# Patient Record
Sex: Female | Born: 1955 | Race: White | Hispanic: No | Marital: Married
Health system: Southern US, Community
[De-identification: ages and names within clinical notes are randomized; demographics above are authoritative.]

## PROBLEM LIST (undated history)

## (undated) DIAGNOSIS — E079 Disorder of thyroid, unspecified: Secondary | ICD-10-CM

## (undated) HISTORY — PX: ABDOMINAL HYSTERECTOMY: SHX81

---

## 2013-04-03 ENCOUNTER — Encounter (HOSPITAL_BASED_OUTPATIENT_CLINIC_OR_DEPARTMENT_OTHER): Payer: Self-pay | Admitting: Emergency Medicine

## 2013-04-03 ENCOUNTER — Emergency Department (HOSPITAL_BASED_OUTPATIENT_CLINIC_OR_DEPARTMENT_OTHER): Payer: PRIVATE HEALTH INSURANCE

## 2013-04-03 ENCOUNTER — Emergency Department (HOSPITAL_BASED_OUTPATIENT_CLINIC_OR_DEPARTMENT_OTHER)
Admission: EM | Admit: 2013-04-03 | Discharge: 2013-04-03 | Disposition: A | Discharge status: Home / Self Care | Payer: PRIVATE HEALTH INSURANCE | Attending: Emergency Medicine | Admitting: Emergency Medicine

## 2013-04-03 DIAGNOSIS — Z79899 Other long term (current) drug therapy: Secondary | ICD-10-CM | POA: Insufficient documentation

## 2013-04-03 DIAGNOSIS — W19XXXA Unspecified fall, initial encounter: Secondary | ICD-10-CM

## 2013-04-03 DIAGNOSIS — IMO0002 Reserved for concepts with insufficient information to code with codable children: Secondary | ICD-10-CM | POA: Insufficient documentation

## 2013-04-03 DIAGNOSIS — E079 Disorder of thyroid, unspecified: Secondary | ICD-10-CM | POA: Insufficient documentation

## 2013-04-03 DIAGNOSIS — M549 Dorsalgia, unspecified: Secondary | ICD-10-CM

## 2013-04-03 DIAGNOSIS — Y939 Activity, unspecified: Secondary | ICD-10-CM | POA: Insufficient documentation

## 2013-04-03 DIAGNOSIS — Y92009 Unspecified place in unspecified non-institutional (private) residence as the place of occurrence of the external cause: Secondary | ICD-10-CM | POA: Insufficient documentation

## 2013-04-03 DIAGNOSIS — W108XXA Fall (on) (from) other stairs and steps, initial encounter: Secondary | ICD-10-CM | POA: Insufficient documentation

## 2013-04-03 DIAGNOSIS — S298XXA Other specified injuries of thorax, initial encounter: Secondary | ICD-10-CM | POA: Insufficient documentation

## 2013-04-03 HISTORY — DX: Disorder of thyroid, unspecified: E07.9

## 2013-04-03 MED ORDER — CYCLOBENZAPRINE HCL 10 MG PO TABS
ORAL_TABLET | ORAL | Status: AC
  Administered 2013-04-03: 12:00:00 10 mg via ORAL
  Filled 2013-04-03: qty 1

## 2013-04-03 MED ORDER — CYCLOBENZAPRINE HCL 10 MG PO TABS
10.0000 mg | ORAL_TABLET | Freq: Once | ORAL | Status: AC
  Administered 2013-04-03: 10 mg via ORAL

## 2013-04-03 MED ORDER — CYCLOBENZAPRINE HCL 10 MG PO TABS: 10.0000 mg | ORAL_TABLET | Freq: Three times a day (TID) | ORAL | Status: AC | PRN

## 2013-04-03 MED ORDER — IBUPROFEN 600 MG PO TABS: 600.0000 mg | ORAL_TABLET | Freq: Three times a day (TID) | ORAL | Status: AC | PRN

## 2013-04-03 MED ORDER — HYDROCODONE-ACETAMINOPHEN 5-325 MG PO TABS
1.0000 | ORAL_TABLET | Freq: Four times a day (QID) | ORAL | Status: AC | PRN
Start: 2013-04-03 — End: ?

## 2013-04-03 MED ORDER — IBUPROFEN 400 MG PO TABS
600.0000 mg | ORAL_TABLET | Freq: Once | ORAL | Status: AC
  Administered 2013-04-03: 11:00:00 600 mg via ORAL
  Filled 2013-04-03 (×2): qty 1

## 2013-04-03 NOTE — ED Provider Notes (Signed)
CSN: [id number]     Arrival date & time 04/03/13  1101 History   First MD Initiated Contact with Patient 04/03/13 1113     Chief Complaint  Patient presents with  . Fall  . Back Pain  . Rib Injury   (Consider location/radiation/quality/duration/timing/severity/associated sxs/prior Treatment) HPI Comments: 58 year old female fell down the steps at home. She slipped on ice and fell down about 6 steps. They are concrete. She fell on the left side of her body her left posterior chest. She believes she has rib fractures she denies any shortness of breath, hemoptysis  Patient is a 58 y.o. female presenting with fall and back pain. The history is provided by the patient.  Fall This is a new problem. The current episode started less than 1 hour ago. Episode frequency: once. The problem has not changed since onset.Associated symptoms include chest pain (posterior). Pertinent negatives include no abdominal pain. Exacerbated by: deep breathing. Nothing relieves the symptoms.  Back Pain Associated symptoms: chest pain (posterior)   Associated symptoms: no abdominal pain and no fever     Past Medical History  Diagnosis Date  . Thyroid disease    Past Surgical History  Procedure Laterality Date  . Abdominal hysterectomy     No family history on file. History  Substance Use Topics  . Smoking status: Not on file  . Smokeless tobacco: Not on file  . Alcohol Use: Not on file   OB History   Grav Para Term Preterm Abortions TAB SAB Ect Mult Living                 Review of Systems  Constitutional: Negative for fever and chills.  Cardiovascular: Positive for chest pain (posterior).  Gastrointestinal: Negative for abdominal pain.  Musculoskeletal: Positive for back pain.  All other systems reviewed and are negative.    Allergies  Review of patient's allergies indicates not on file.  Home Medications   Current Outpatient Rx  Name  Route  Sig  Dispense  Refill  . UNABLE TO FIND     [facility] thyroid daily          BP 126/66  Pulse 92  Resp 16  Ht 5\' 5"  (1.651 m)  Wt 117 lb (53.071 kg)  BMI 19.47 kg/m2  SpO2 100%  LMP 04/03/2013 Physical Exam  Nursing note and vitals reviewed. Constitutional: She is oriented to person, place, and time. She appears well-developed and well-nourished. No distress.  HENT:  Head: Normocephalic and atraumatic.  Eyes: EOM are normal. Pupils are equal, round, and reactive to light.  Neck: Normal range of motion. Neck supple.  Cardiovascular: Normal rate and regular rhythm.  Exam reveals no friction rub.   No murmur heard. Pulmonary/Chest: Effort normal and breath sounds normal. No respiratory distress. She has no wheezes. She has no rales. She exhibits tenderness (mild, posterior, inferior L chest, no bruising).  Abdominal: Soft. She exhibits no distension. There is no tenderness. There is no rebound.  Musculoskeletal: Normal range of motion. She exhibits no edema.  Neurological: She is alert and oriented to person, place, and time.  Skin: She is not diaphoretic.    ED Course  Procedures (including critical care time) Labs Review Labs Reviewed - No data to display Imaging Review Dg Chest 2 View  04/03/2013   CLINICAL DATA:  Left chest wall pain after a fall this morning.  EXAM: CHEST  2 VIEW  COMPARISON:  None.  FINDINGS: Heart, mediastinum hila are unremarkable.  Lungs are  hyperexpanded but clear. No pleural effusion or pneumothorax.  Bony thorax is demineralized but intact.  IMPRESSION: No acute cardiopulmonary disease.  No fracture is seen.   Electronically Signed   By: Amie Portlandavid  Ormond M.D.   On: 04/03/2013 12:08   Dg Ribs Unilateral Left  04/03/2013   CLINICAL DATA:  Fall this morning adenitis. Left-sided chest wall pain.  EXAM: LEFT RIBS - 2 VIEW  COMPARISON:  None.  FINDINGS: No fracture or other bone lesions are seen involving the ribs.  IMPRESSION: Negative.   Electronically Signed   By: Amie Portlandavid  Ormond M.D.   On: 04/03/2013 12:08     EKG Interpretation   None       MDM   1. Fall   2. Back pain    58 year old female presents after a fall. She fell down 6 steps at home. She went on left side of her posterior chest. She doesn't difficulty breathing, hemoptysis. She's concerned she broke ribs. She is on no medications other than a natural thyroid supplement. Here she is up about the room stating it's more comfortable and she stands. She has no spinal tenderness. She has no rib deformities, paradoxical motion of her chest, bruising. She is tender in her left posterior inferior chest wall. Will obtain rib films. Motrin given. Films negative. Given pain meds, muscle relaxers, motrin. Stable for discharge.  Dagmar HaitWilliam Dyann Goodspeed, MD 04/03/13 [phone]

## 2013-04-03 NOTE — ED Notes (Signed)
MD at bedside. 

## 2013-04-03 NOTE — ED Notes (Signed)
Pt reports she fell down 6 steps this am onto ice.  Pt reports left rib pain that worsens with deep breath and movement.  She also has back pain.

## 2013-04-03 NOTE — ED Notes (Signed)
Pt reports left rib pain that worsens with deep breath and movement.

## 2014-10-16 IMAGING — CR DG CHEST 2V
2 series · 2 of 2 positions shown · non-contrast
Comparison: None.

CLINICAL DATA: Left chest wall pain after a fall this morning.

EXAM:
CHEST  2 VIEW

[w chest pa]
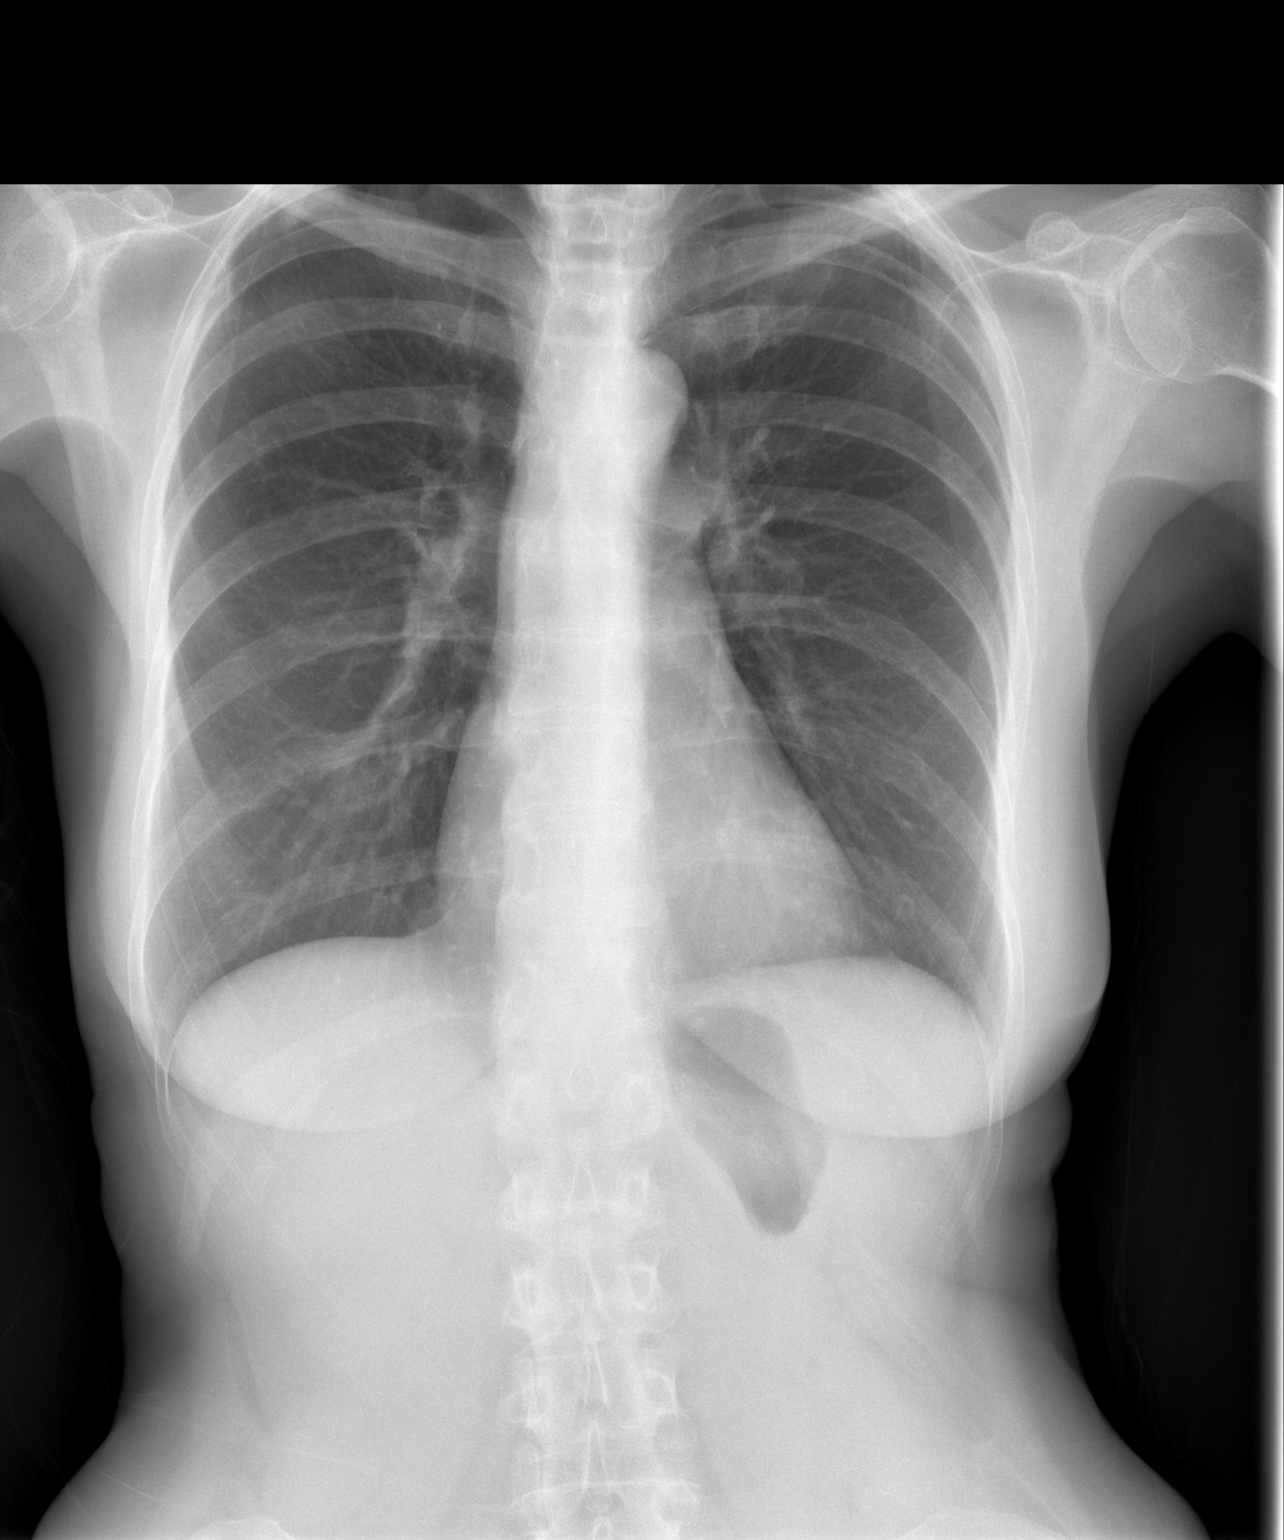

[w chest lat]
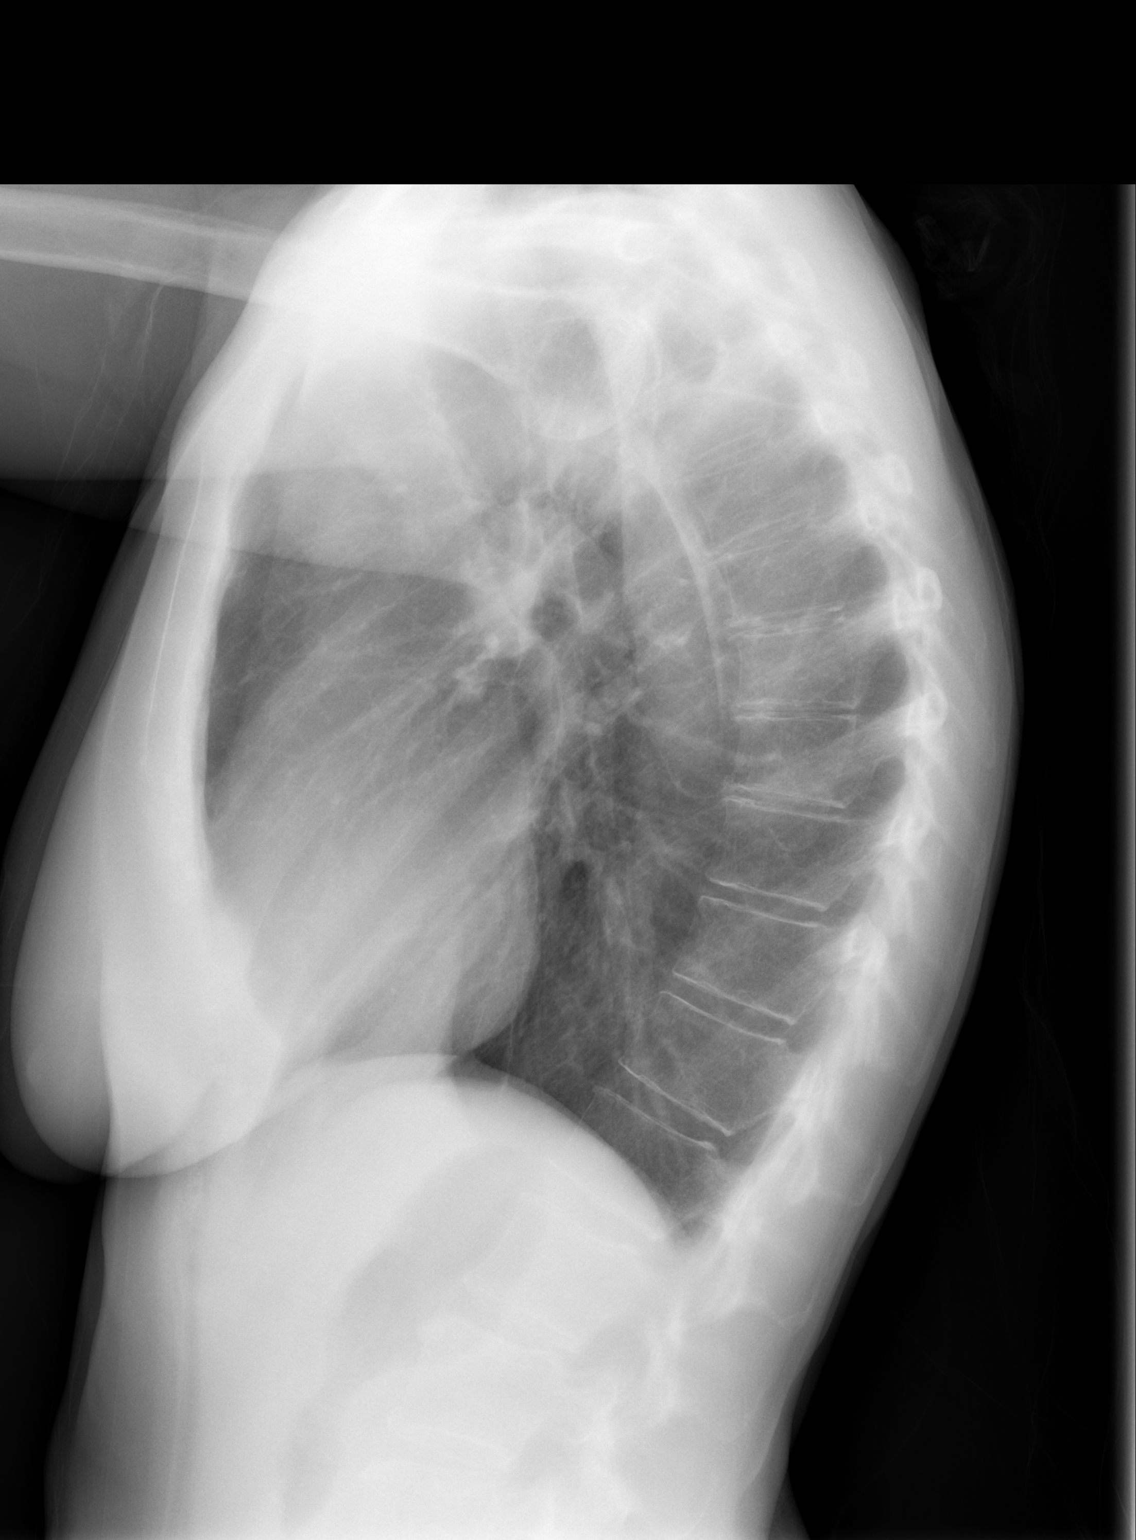

[2 of 2 positions shown; findings below may reference images not displayed]

FINDINGS: Heart, mediastinum hila are unremarkable.

Lungs are hyperexpanded but clear. No pleural effusion or
pneumothorax.

Bony thorax is demineralized but intact.
IMPRESSION: No acute cardiopulmonary disease.  No fracture is seen.

## 2014-10-16 IMAGING — CR DG RIBS 2V*L*
2 series · 2 of 2 positions shown · non-contrast
Comparison: None.

CLINICAL DATA: Fall this morning adenitis. Left-sided chest wall
pain.

EXAM:
LEFT RIBS - 2 VIEW

[w ribs ap/pa upper left]
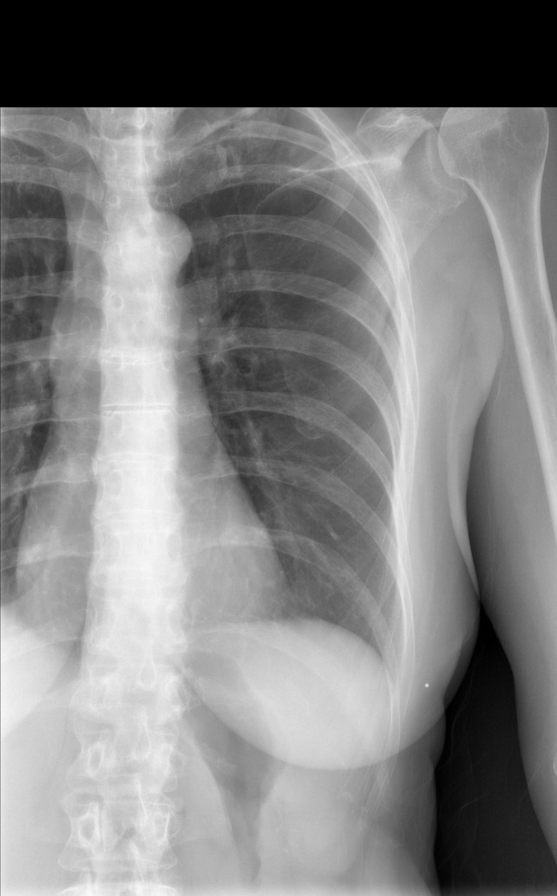

[w ribs oblique left]
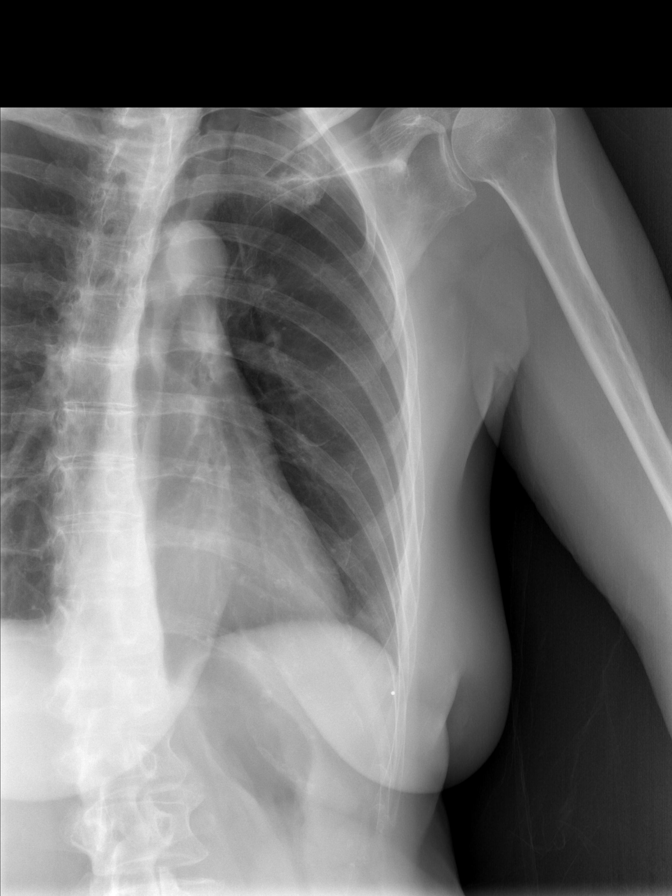

[2 of 2 positions shown; findings below may reference images not displayed]

FINDINGS: No fracture or other bone lesions are seen involving the ribs.
IMPRESSION: Negative.
